# Patient Record
Sex: Female | Born: 1950 | Race: White | Hispanic: No | Smoking: Former smoker
Health system: Southern US, Community
[De-identification: ages and names within clinical notes are randomized; demographics above are authoritative.]

## PROBLEM LIST (undated history)

## (undated) DIAGNOSIS — S42009A Fracture of unspecified part of unspecified clavicle, initial encounter for closed fracture: Secondary | ICD-10-CM

## (undated) DIAGNOSIS — R0600 Dyspnea, unspecified: Secondary | ICD-10-CM

## (undated) HISTORY — PX: SHOULDER SURGERY: SHX246

---

## 2005-04-28 ENCOUNTER — Ambulatory Visit: Payer: Self-pay | Admitting: Obstetrics and Gynecology

## 2006-05-01 ENCOUNTER — Ambulatory Visit: Payer: Self-pay | Admitting: Obstetrics and Gynecology

## 2007-02-17 ENCOUNTER — Emergency Department: Payer: Self-pay | Admitting: Emergency Medicine

## 2007-05-08 ENCOUNTER — Ambulatory Visit: Payer: Self-pay | Admitting: Obstetrics and Gynecology

## 2008-05-21 ENCOUNTER — Ambulatory Visit: Payer: Self-pay | Admitting: Obstetrics and Gynecology

## 2009-05-26 ENCOUNTER — Ambulatory Visit: Payer: Self-pay | Admitting: Obstetrics and Gynecology

## 2009-06-04 ENCOUNTER — Ambulatory Visit: Payer: Self-pay | Admitting: Obstetrics and Gynecology

## 2010-06-01 ENCOUNTER — Ambulatory Visit: Payer: Self-pay | Admitting: Obstetrics and Gynecology

## 2011-06-07 ENCOUNTER — Ambulatory Visit: Payer: Self-pay | Admitting: Obstetrics and Gynecology

## 2012-06-07 ENCOUNTER — Ambulatory Visit: Payer: Self-pay | Admitting: Obstetrics and Gynecology

## 2013-06-11 ENCOUNTER — Ambulatory Visit: Payer: Self-pay | Admitting: Obstetrics and Gynecology

## 2014-07-04 ENCOUNTER — Ambulatory Visit: Payer: Self-pay | Admitting: Obstetrics and Gynecology

## 2016-01-12 ENCOUNTER — Other Ambulatory Visit: Payer: Self-pay | Admitting: Obstetrics and Gynecology

## 2016-01-12 DIAGNOSIS — Z1231 Encounter for screening mammogram for malignant neoplasm of breast: Secondary | ICD-10-CM

## 2016-01-18 ENCOUNTER — Ambulatory Visit
Admission: RE | Admit: 2016-01-18 | Discharge: 2016-01-18 | Disposition: A | Discharge status: Home / Self Care | Payer: BC Managed Care – PPO | Source: Ambulatory Visit | Attending: Obstetrics and Gynecology | Admitting: Obstetrics and Gynecology

## 2016-01-18 DIAGNOSIS — Z1231 Encounter for screening mammogram for malignant neoplasm of breast: Secondary | ICD-10-CM | POA: Diagnosis present

## 2017-01-31 ENCOUNTER — Other Ambulatory Visit: Payer: Self-pay | Admitting: Obstetrics and Gynecology

## 2017-01-31 DIAGNOSIS — Z1231 Encounter for screening mammogram for malignant neoplasm of breast: Secondary | ICD-10-CM

## 2017-02-24 ENCOUNTER — Ambulatory Visit
Admission: RE | Admit: 2017-02-24 | Discharge: 2017-02-24 | Disposition: A | Discharge status: Home / Self Care | Payer: Medicare Other | Source: Ambulatory Visit | Attending: Obstetrics and Gynecology | Admitting: Obstetrics and Gynecology

## 2017-02-24 DIAGNOSIS — Z1231 Encounter for screening mammogram for malignant neoplasm of breast: Secondary | ICD-10-CM

## 2018-07-29 ENCOUNTER — Other Ambulatory Visit: Payer: Self-pay

## 2018-07-29 ENCOUNTER — Emergency Department: Payer: Medicare Other

## 2018-07-29 ENCOUNTER — Emergency Department
Admission: EM | Admit: 2018-07-29 | Discharge: 2018-07-29 | Disposition: A | Discharge status: Home / Self Care | Payer: Medicare Other | Attending: Emergency Medicine | Admitting: Emergency Medicine

## 2018-07-29 DIAGNOSIS — S42022A Displaced fracture of shaft of left clavicle, initial encounter for closed fracture: Secondary | ICD-10-CM | POA: Insufficient documentation

## 2018-07-29 DIAGNOSIS — W548XXA Other contact with dog, initial encounter: Secondary | ICD-10-CM | POA: Insufficient documentation

## 2018-07-29 DIAGNOSIS — Y929 Unspecified place or not applicable: Secondary | ICD-10-CM | POA: Insufficient documentation

## 2018-07-29 DIAGNOSIS — Y9389 Activity, other specified: Secondary | ICD-10-CM | POA: Insufficient documentation

## 2018-07-29 DIAGNOSIS — S42009A Fracture of unspecified part of unspecified clavicle, initial encounter for closed fracture: Secondary | ICD-10-CM

## 2018-07-29 DIAGNOSIS — Y999 Unspecified external cause status: Secondary | ICD-10-CM | POA: Insufficient documentation

## 2018-07-29 DIAGNOSIS — M25512 Pain in left shoulder: Secondary | ICD-10-CM | POA: Diagnosis present

## 2018-07-29 HISTORY — DX: Fracture of unspecified part of unspecified clavicle, initial encounter for closed fracture: S42.009A

## 2018-07-29 MED ORDER — OXYCODONE HCL 5 MG PO TABS: 5.0000 mg | ORAL_TABLET | Freq: Four times a day (QID) | ORAL | 0 refills | Status: DC | PRN

## 2018-07-29 MED ORDER — IBUPROFEN 600 MG PO TABS
600.0000 mg | ORAL_TABLET | Freq: Once | ORAL | Status: AC
  Administered 2018-07-29: 600 mg via ORAL
  Filled 2018-07-29: qty 1

## 2018-07-29 MED ORDER — NAPROXEN 500 MG PO TABS: 500.0000 mg | ORAL_TABLET | Freq: Two times a day (BID) | ORAL | 0 refills | Status: AC

## 2018-07-29 MED ORDER — HYDROMORPHONE HCL 1 MG/ML IJ SOLN
0.5000 mg | Freq: Once | INTRAMUSCULAR | Status: AC
  Administered 2018-07-29: 0.5 mg via INTRAMUSCULAR
  Filled 2018-07-29: qty 1

## 2018-07-29 NOTE — ED Provider Notes (Signed)
[facility] Emergency Department Provider Note ____________________________________________  Time seen: Approximately 8:58 PM  I have reviewed the triage vital signs and the nursing notes.   HISTORY  Chief Complaint Shoulder Pain    HPI Monica Coffey is a 67 y.o. female who presents to the emergency department for evaluation and treatment of left shoulder pain. While walking her dogs she was pulled down and drug on the ground. She denies loss of consciousness or striking her head. No alleviating measures attempted prior to arrival.  No past medical history on file.  There are no active problems to display for this patient.   No past surgical history on file.  Prior to Admission medications   Medication Sig Start Date End Date Taking? Authorizing Provider  naproxen (NAPROSYN) 500 MG tablet Take 1 tablet (500 mg total) by mouth 2 (two) times daily with a meal. 07/29/18   Jacey Eckerson B, FNP  oxyCODONE (ROXICODONE) 5 MG immediate release tablet Take 1 tablet (5 mg total) by mouth every 6 (six) hours as needed for severe pain. 07/29/18   Chinita Pester, FNP    Allergies Patient has no known allergies.  No family history on file.  Social History Social History   Tobacco Use  . Smoking status: Not on file  Substance Use Topics  . Alcohol use: Not on file  . Drug use: Not on file    Review of Systems Constitutional: Negative for fever. Cardiovascular: Negative for chest pain. Respiratory: Negative for shortness of breath. Musculoskeletal: Positive for left shoulder pain. Skin: Positive for abrasions to the left elbow and forearm.  Neurological: Negative for decrease in sensation  ____________________________________________   PHYSICAL EXAM:  VITAL SIGNS: ED Triage Vitals  Enc Vitals Group     BP 07/29/18 2048 140/73     Pulse Rate 07/29/18 2048 90     Resp 07/29/18 2048 20     Temp 07/29/18 2048 98.6 F (37 C)     Temp src --    SpO2 07/29/18 2048 98 %     Weight 07/29/18 2046 106 lb (48.1 kg)     Height 07/29/18 2046 5\' 2"  (1.575 m)     Head Circumference --      Peak Flow --      Pain Score 07/29/18 2046 10     Pain Loc --      Pain Edu? --      Excl. in GC? --     Constitutional: Alert and oriented. Well appearing and in no acute distress. Eyes: Conjunctivae are clear without discharge or drainage Head: Atraumatic Neck: Supple Respiratory: No cough. Respirations are even and unlabored. Musculoskeletal: Focal pain over the left acromion/clavicle area with obvious deformity. FROM of the left elbow, wrist, and hand. No focal midline tenderness over the length of the spine. FROM of the lower extremities. Neurologic: Awake, alert, oriented x 4.  Skin: Superficial abrasions to the left forearm and elbow. No tenting of the skin of preclavicular area. Psychiatric: Affect and behavior are appropriate.  ____________________________________________   LABS (all labs ordered are listed, but only abnormal results are displayed)  Labs Reviewed - No data to display ____________________________________________  RADIOLOGY  Oblique fracture through the mid left clavicle with displacement. ____________________________________________   PROCEDURES  Procedures  ____________________________________________   INITIAL IMPRESSION / ASSESSMENT AND PLAN / ED COURSE  Monica Coffey is a 67 y.o. who presents to the emergency department for treatment and evaluation of left shoulder pain  after mechanical non-syncopal fall. X-ray shows a displaced clavicle fracture. Results were discussed with the patient who reports she is a friend of Van Clines, [place] in orthopedics. She plans to call and schedule an appointment for follow up. She was given 0.5mg  of dilaudid and ibuprofen with little relief while here. She was also placed in a sling. Prescriptions for pain control were submitted to her pharmacy. She was discharged home in the  care of family.  Medications  HYDROmorphone (DILAUDID) injection 0.5 mg (0.5 mg Intramuscular Given 07/29/18 2159)  ibuprofen (ADVIL,MOTRIN) tablet 600 mg (600 mg Oral Given 07/29/18 2233)    Pertinent labs & imaging results that were available during my care of the patient were reviewed by me and considered in my medical decision making (see chart for details).  _________________________________________   FINAL CLINICAL IMPRESSION(S) / ED DIAGNOSES  Final diagnoses:  Closed displaced fracture of shaft of left clavicle, initial encounter    ED Discharge Orders         Ordered    oxyCODONE (ROXICODONE) 5 MG immediate release tablet  Every 6 hours PRN     07/29/18 2156    naproxen (NAPROSYN) 500 MG tablet  2 times daily with meals     07/29/18 2156          If controlled substance prescribed during this visit, 12 month history viewed on the [facility] prior to issuing an initial prescription for Schedule II or III opiod.    Chinita Pester, FNP 07/29/18 2253    Sharman Cheek, MD 07/29/18 2358

## 2018-07-29 NOTE — Discharge Instructions (Signed)
Please call and schedule an appointment with orthopedics.  Return to the ER for symptoms that change or worsen.  It will likely be more comfortable sleeping in an upright position or with multiple pillows behind you.

## 2018-07-29 NOTE — ED Triage Notes (Addendum)
Reports walking dogs and they pulled her down.  Patient reporting left shoulder pain.

## 2018-07-29 NOTE — ED Notes (Signed)
Patient declined discharge vital signs. 

## 2018-09-17 ENCOUNTER — Other Ambulatory Visit: Payer: Self-pay | Admitting: Obstetrics and Gynecology

## 2018-09-17 DIAGNOSIS — Z1231 Encounter for screening mammogram for malignant neoplasm of breast: Secondary | ICD-10-CM

## 2018-10-10 ENCOUNTER — Ambulatory Visit
Admission: RE | Admit: 2018-10-10 | Discharge: 2018-10-10 | Disposition: A | Discharge status: Home / Self Care | Payer: Medicare Other | Source: Ambulatory Visit | Attending: Obstetrics and Gynecology | Admitting: Obstetrics and Gynecology

## 2018-10-10 DIAGNOSIS — Z1231 Encounter for screening mammogram for malignant neoplasm of breast: Secondary | ICD-10-CM | POA: Diagnosis not present

## 2018-10-18 ENCOUNTER — Ambulatory Visit: Payer: Self-pay | Admitting: Orthopedic Surgery

## 2018-10-30 ENCOUNTER — Encounter
Admission: RE | Admit: 2018-10-30 | Discharge: 2018-10-30 | Disposition: A | Discharge status: Home / Self Care | Payer: Medicare Other | Source: Ambulatory Visit | Attending: Orthopedic Surgery | Admitting: Orthopedic Surgery

## 2018-10-30 ENCOUNTER — Other Ambulatory Visit: Payer: Self-pay

## 2018-10-30 DIAGNOSIS — Z01818 Encounter for other preprocedural examination: Secondary | ICD-10-CM

## 2018-10-30 DIAGNOSIS — S42022K Displaced fracture of shaft of left clavicle, subsequent encounter for fracture with nonunion: Secondary | ICD-10-CM | POA: Diagnosis not present

## 2018-10-30 DIAGNOSIS — Z87891 Personal history of nicotine dependence: Secondary | ICD-10-CM | POA: Diagnosis not present

## 2018-10-30 DIAGNOSIS — Z791 Long term (current) use of non-steroidal anti-inflammatories (NSAID): Secondary | ICD-10-CM | POA: Diagnosis not present

## 2018-10-30 DIAGNOSIS — Z79899 Other long term (current) drug therapy: Secondary | ICD-10-CM | POA: Diagnosis not present

## 2018-10-30 DIAGNOSIS — X58XXXD Exposure to other specified factors, subsequent encounter: Secondary | ICD-10-CM | POA: Diagnosis not present

## 2018-10-30 DIAGNOSIS — Z79891 Long term (current) use of opiate analgesic: Secondary | ICD-10-CM | POA: Diagnosis not present

## 2018-10-30 HISTORY — DX: Dyspnea, unspecified: R06.00

## 2018-10-30 HISTORY — DX: Fracture of unspecified part of unspecified clavicle, initial encounter for closed fracture: S42.009A

## 2018-10-30 LAB — CBC
HEMATOCRIT: 41.5 % (ref 36.0–46.0)
HEMOGLOBIN: 12.9 g/dL (ref 12.0–15.0)
MCH: 32.3 pg (ref 26.0–34.0)
MCHC: 31.1 g/dL (ref 30.0–36.0)
MCV: 104 fL — AB (ref 80.0–100.0)
Platelets: 270 10*3/uL (ref 150–400)
RBC: 3.99 MIL/uL (ref 3.87–5.11)
RDW: 13.2 % (ref 11.5–15.5)
WBC: 4 10*3/uL (ref 4.0–10.5)
nRBC: 0 % (ref 0.0–0.2)

## 2018-10-30 LAB — BASIC METABOLIC PANEL
Anion gap: 6 (ref 5–15)
BUN: 20 mg/dL (ref 8–23)
CHLORIDE: 104 mmol/L (ref 98–111)
CO2: 30 mmol/L (ref 22–32)
CREATININE: 0.84 mg/dL (ref 0.44–1.00)
Calcium: 9.4 mg/dL (ref 8.9–10.3)
GFR calc non Af Amer: >60 mL/min (ref >60)
GLUCOSE: 86 mg/dL (ref 70–99)
Potassium: 4.4 mmol/L (ref 3.5–5.1)
Sodium: 140 mmol/L (ref 135–145)

## 2018-10-30 NOTE — Patient Instructions (Signed)
Your procedure is scheduled on: Friday, November 02, 2018  Report to THE SECOND FLOOR OF THE MEDICAL MALL    DO NOT STOP ON FIRST FLOOR TO REGISTER  To find out your arrival time please call [phone] between 1PM - 3PM on Thursday, November 01, 2018  Remember: Instructions that are not followed completely may result in serious medical risk,  up to and including death, or upon the discretion of your surgeon and anesthesiologist your  surgery may need to be rescheduled.     _X__ 1. Do not eat food after midnight the night before your procedure.                 No gum chewing or hard candies.                    ABSOLUTELY NOTHING SOLID IN YOUR MOUTH AFTER MIDNIGHT                  You may drink clear liquids up to 2 hours before you are scheduled to arrive for your surgery-                  DO not drink clear liquids within 2 hours of the start of your surgery.                   Clear Liquids include:  water, apple juice without pulp, clear carbohydrate                 drink such as Clearfast of Gatorade, Black Coffee or Tea (Do not add                 anything to coffee or tea).  __X__2.  On the morning of surgery brush your teeth with toothpaste and water,                     You may rinse your mouth with mouthwash if you wish.                         Do not swallow any toothpaste of mouthwash.     _X__ 3.  No Alcohol for 24 hours before or after surgery.   _X__ 4.  Do Not Smoke or use e-cigarettes For 24 Hours Prior to Your Surgery.                 Do not use any chewable tobacco products for at least 6 hours prior to                 surgery.  ____  5.  Bring all medications with you on the day of surgery if instructed.   ____  6.  Notify your doctor if there is any change in your medical condition      (cold, fever, infections).     Do not wear jewelry, make-up, hairpins, clips or nail polish. Do not wear lotions, powders, or perfumes. You may wear  deodorant. Do not shave 48 hours prior to surgery. Men may shave face and neck. Do not bring valuables to the hospital.    [facility] is not responsible for any belongings or valuables.  Contacts, dentures or bridgework may not be worn into surgery. Leave your suitcase in the car. After surgery it may be brought to your room. For patients admitted to the hospital, discharge time is determined by your treatment team.  Patients discharged the day of surgery will not be allowed to drive home.   Please read over the following fact sheets that you were given:   INCENTIVE SPIROMETER INSTRUCTIONS                    PREPARING FOR SURGERY   _X___ Take these medicines the morning of surgery with A SIP OF WATER:    1. EFFEXOR  2. DO NOT TAKE TYLENOL IN THE MORNING BUT IT IS OKAY TO                    TAKE ANY OTHER TIME  3.   4.  5.  6.  ____ Fleet Enema (as directed)   __X__ Use CHG Soap as directed  __X__ Stop ALL ASPIRIN PRODUCTS TODAY  ___X_ Stop Anti-inflammatories TODAY                THIS INCLUDES ADVIL / ALEVE / IBUPROFEN / MOTRIN / NAPROXYN  __X__ Stop supplements until after surgery.    ____ Bring C-Pap to the hospital.   CONTINUE TO TAKE TRAZADONE AT NIGHT  WEAR A LARGE LOOSE BUTTON DOWN SHIRT TO THE HOSPITAL.  ALSO, YOU MIGHT WANT TO BRING A T-SHIRT TO PUT UNDER THE SLING

## 2018-11-01 MED ORDER — CEFAZOLIN SODIUM-DEXTROSE 2-4 GM/100ML-% IV SOLN
2.0000 g | INTRAVENOUS | Status: AC
  Administered 2018-11-02: 2 g via INTRAVENOUS

## 2018-11-02 ENCOUNTER — Other Ambulatory Visit: Payer: Self-pay

## 2018-11-02 ENCOUNTER — Ambulatory Visit
Admission: RE | Admit: 2018-11-02 | Discharge: 2018-11-02 | Disposition: A | Discharge status: Home / Self Care | Payer: Medicare Other | Source: Ambulatory Visit | Attending: Orthopedic Surgery | Admitting: Orthopedic Surgery

## 2018-11-02 ENCOUNTER — Ambulatory Visit: Payer: Medicare Other | Admitting: Anesthesiology

## 2018-11-02 ENCOUNTER — Encounter: Payer: Self-pay | Admitting: *Deleted

## 2018-11-02 ENCOUNTER — Encounter
Admission: RE | Disposition: A | Discharge status: Home / Self Care | Payer: Self-pay | Source: Ambulatory Visit | Attending: Orthopedic Surgery

## 2018-11-02 ENCOUNTER — Ambulatory Visit: Payer: Medicare Other

## 2018-11-02 DIAGNOSIS — S42022K Displaced fracture of shaft of left clavicle, subsequent encounter for fracture with nonunion: Secondary | ICD-10-CM | POA: Insufficient documentation

## 2018-11-02 DIAGNOSIS — Z419 Encounter for procedure for purposes other than remedying health state, unspecified: Secondary | ICD-10-CM

## 2018-11-02 DIAGNOSIS — Z79891 Long term (current) use of opiate analgesic: Secondary | ICD-10-CM | POA: Insufficient documentation

## 2018-11-02 DIAGNOSIS — Z87891 Personal history of nicotine dependence: Secondary | ICD-10-CM | POA: Insufficient documentation

## 2018-11-02 DIAGNOSIS — X58XXXD Exposure to other specified factors, subsequent encounter: Secondary | ICD-10-CM | POA: Insufficient documentation

## 2018-11-02 DIAGNOSIS — Z79899 Other long term (current) drug therapy: Secondary | ICD-10-CM | POA: Insufficient documentation

## 2018-11-02 DIAGNOSIS — Z791 Long term (current) use of non-steroidal anti-inflammatories (NSAID): Secondary | ICD-10-CM | POA: Insufficient documentation

## 2018-11-02 HISTORY — PX: ORIF CLAVICULAR FRACTURE: SHX5055

## 2018-11-02 SURGERY — OPEN REDUCTION INTERNAL FIXATION (ORIF) CLAVICULAR FRACTURE
Anesthesia: General | Laterality: Left

## 2018-11-02 MED ORDER — LIDOCAINE HCL (CARDIAC) PF 100 MG/5ML IV SOSY
PREFILLED_SYRINGE | INTRAVENOUS | Status: DC | PRN
  Administered 2018-11-02: 100 mg via INTRAVENOUS

## 2018-11-02 MED ORDER — GABAPENTIN 300 MG PO CAPS
ORAL_CAPSULE | ORAL | Status: AC
  Administered 2018-11-02: 300 mg via ORAL
  Filled 2018-11-02: qty 1

## 2018-11-02 MED ORDER — BUPIVACAINE HCL (PF) 0.5 % IJ SOLN
INTRAMUSCULAR | Status: DC | PRN
  Administered 2018-11-02: 10 mL via PERINEURAL

## 2018-11-02 MED ORDER — FENTANYL CITRATE (PF) 100 MCG/2ML IJ SOLN
INTRAMUSCULAR | Status: DC | PRN
  Administered 2018-11-02: 50 ug via INTRAVENOUS
  Administered 2018-11-02 (×2): 25 ug via INTRAVENOUS

## 2018-11-02 MED ORDER — LACTATED RINGERS IV SOLN
INTRAVENOUS | Status: DC
  Administered 2018-11-02: 09:00:00 via INTRAVENOUS

## 2018-11-02 MED ORDER — MIDAZOLAM HCL 2 MG/2ML IJ SOLN
INTRAMUSCULAR | Status: DC | PRN
  Administered 2018-11-02 (×2): 1 mg via INTRAVENOUS

## 2018-11-02 MED ORDER — MIDAZOLAM HCL 2 MG/2ML IJ SOLN
1.0000 mg | Freq: Once | INTRAMUSCULAR | Status: AC
  Administered 2018-11-02: 1 mg via INTRAVENOUS

## 2018-11-02 MED ORDER — BUPIVACAINE HCL (PF) 0.5 % IJ SOLN
INTRAMUSCULAR | Status: AC
  Filled 2018-11-02: qty 10

## 2018-11-02 MED ORDER — HYDROCODONE-ACETAMINOPHEN 5-325 MG PO TABS: 1.0000 | ORAL_TABLET | ORAL | Status: DC | PRN

## 2018-11-02 MED ORDER — PROMETHAZINE HCL 25 MG/ML IJ SOLN: 6.2500 mg | INTRAMUSCULAR | Status: DC | PRN

## 2018-11-02 MED ORDER — OXYCODONE HCL 5 MG/5ML PO SOLN: 5.0000 mg | Freq: Once | ORAL | Status: DC | PRN

## 2018-11-02 MED ORDER — BACITRACIN 50000 UNITS IM SOLR
INTRAMUSCULAR | Status: AC
  Filled 2018-11-02: qty 1

## 2018-11-02 MED ORDER — PROPOFOL 10 MG/ML IV BOLUS
INTRAVENOUS | Status: AC
  Filled 2018-11-02: qty 20

## 2018-11-02 MED ORDER — FAMOTIDINE 20 MG PO TABS
20.0000 mg | ORAL_TABLET | Freq: Once | ORAL | Status: AC
  Administered 2018-11-02: 20 mg via ORAL

## 2018-11-02 MED ORDER — OXYCODONE HCL 5 MG PO TABS: 5.0000 mg | ORAL_TABLET | Freq: Four times a day (QID) | ORAL | 0 refills | Status: AC | PRN

## 2018-11-02 MED ORDER — BUPIVACAINE-EPINEPHRINE (PF) 0.25% -1:200000 IJ SOLN
INTRAMUSCULAR | Status: DC | PRN
  Administered 2018-11-02: 6 mL

## 2018-11-02 MED ORDER — GABAPENTIN 300 MG PO CAPS: 300.0000 mg | ORAL_CAPSULE | Freq: Three times a day (TID) | ORAL | Status: DC

## 2018-11-02 MED ORDER — ACETAMINOPHEN 500 MG PO TABS: 500.0000 mg | ORAL_TABLET | Freq: Four times a day (QID) | ORAL | Status: DC

## 2018-11-02 MED ORDER — BUPIVACAINE LIPOSOME 1.3 % IJ SUSP
INTRAMUSCULAR | Status: DC | PRN
  Administered 2018-11-02: 20 mL via PERINEURAL

## 2018-11-02 MED ORDER — MEPERIDINE HCL 50 MG/ML IJ SOLN: 6.2500 mg | INTRAMUSCULAR | Status: DC | PRN

## 2018-11-02 MED ORDER — ACETAMINOPHEN 325 MG PO TABS: 325.0000 mg | ORAL_TABLET | Freq: Four times a day (QID) | ORAL | Status: DC | PRN

## 2018-11-02 MED ORDER — SUCCINYLCHOLINE CHLORIDE 20 MG/ML IJ SOLN
INTRAMUSCULAR | Status: DC | PRN
  Administered 2018-11-02: 100 mg via INTRAVENOUS

## 2018-11-02 MED ORDER — GLYCOPYRROLATE 0.2 MG/ML IJ SOLN
INTRAMUSCULAR | Status: DC | PRN
  Administered 2018-11-02: 0.2 mg via INTRAVENOUS

## 2018-11-02 MED ORDER — KETOROLAC TROMETHAMINE 15 MG/ML IJ SOLN: 7.5000 mg | Freq: Four times a day (QID) | INTRAMUSCULAR | Status: DC

## 2018-11-02 MED ORDER — ONDANSETRON HCL 4 MG/2ML IJ SOLN
INTRAMUSCULAR | Status: DC | PRN
  Administered 2018-11-02 (×2): 4 mg via INTRAVENOUS

## 2018-11-02 MED ORDER — LIDOCAINE HCL (PF) 1 % IJ SOLN
INTRAMUSCULAR | Status: AC
  Filled 2018-11-02: qty 5

## 2018-11-02 MED ORDER — BUPIVACAINE LIPOSOME 1.3 % IJ SUSP
INTRAMUSCULAR | Status: AC
  Filled 2018-11-02: qty 20

## 2018-11-02 MED ORDER — BUPIVACAINE-EPINEPHRINE (PF) 0.25% -1:200000 IJ SOLN
INTRAMUSCULAR | Status: AC
  Filled 2018-11-02: qty 30

## 2018-11-02 MED ORDER — DOCUSATE SODIUM 100 MG PO CAPS: 100.0000 mg | ORAL_CAPSULE | Freq: Every day | ORAL | 2 refills | Status: AC | PRN

## 2018-11-02 MED ORDER — FAMOTIDINE 20 MG PO TABS
ORAL_TABLET | ORAL | Status: AC
  Administered 2018-11-02: 20 mg via ORAL
  Filled 2018-11-02: qty 1

## 2018-11-02 MED ORDER — PHENYLEPHRINE HCL 10 MG/ML IJ SOLN
INTRAMUSCULAR | Status: DC | PRN
  Administered 2018-11-02 (×3): 100 ug via INTRAVENOUS

## 2018-11-02 MED ORDER — SODIUM CHLORIDE 0.9 % IV SOLN
INTRAVENOUS | Status: DC | PRN
  Administered 2018-11-02: 50 ug/min via INTRAVENOUS

## 2018-11-02 MED ORDER — PHENOL 1.4 % MT LIQD: 1.0000 | OROMUCOSAL | Status: DC | PRN

## 2018-11-02 MED ORDER — ACETAMINOPHEN 500 MG PO TABS
ORAL_TABLET | ORAL | Status: AC
  Administered 2018-11-02: 1000 mg via ORAL
  Filled 2018-11-02: qty 2

## 2018-11-02 MED ORDER — DEXAMETHASONE SODIUM PHOSPHATE 10 MG/ML IJ SOLN
INTRAMUSCULAR | Status: DC | PRN
  Administered 2018-11-02: 10 mg via INTRAVENOUS

## 2018-11-02 MED ORDER — DOCUSATE SODIUM 100 MG PO CAPS: 100.0000 mg | ORAL_CAPSULE | Freq: Two times a day (BID) | ORAL | Status: DC

## 2018-11-02 MED ORDER — MENTHOL 3 MG MT LOZG: 1.0000 | LOZENGE | OROMUCOSAL | Status: DC | PRN

## 2018-11-02 MED ORDER — MIDAZOLAM HCL 2 MG/2ML IJ SOLN
INTRAMUSCULAR | Status: AC
  Filled 2018-11-02: qty 2

## 2018-11-02 MED ORDER — LIDOCAINE HCL (PF) 1 % IJ SOLN
INTRAMUSCULAR | Status: DC | PRN
  Administered 2018-11-02: 3 mL

## 2018-11-02 MED ORDER — CEFAZOLIN SODIUM-DEXTROSE 2-4 GM/100ML-% IV SOLN
INTRAVENOUS | Status: AC
  Filled 2018-11-02: qty 100

## 2018-11-02 MED ORDER — METOCLOPRAMIDE HCL 10 MG PO TABS: 5.0000 mg | ORAL_TABLET | Freq: Three times a day (TID) | ORAL | Status: DC | PRN

## 2018-11-02 MED ORDER — SEVOFLURANE IN SOLN
RESPIRATORY_TRACT | Status: AC
  Filled 2018-11-02: qty 250

## 2018-11-02 MED ORDER — FENTANYL CITRATE (PF) 100 MCG/2ML IJ SOLN: 25.0000 ug | INTRAMUSCULAR | Status: DC | PRN

## 2018-11-02 MED ORDER — FENTANYL CITRATE (PF) 100 MCG/2ML IJ SOLN
50.0000 ug | Freq: Once | INTRAMUSCULAR | Status: AC
  Administered 2018-11-02: 50 ug via INTRAVENOUS

## 2018-11-02 MED ORDER — ACETAMINOPHEN 500 MG PO TABS
1000.0000 mg | ORAL_TABLET | Freq: Once | ORAL | Status: AC
  Administered 2018-11-02: 1000 mg via ORAL

## 2018-11-02 MED ORDER — CHLORHEXIDINE GLUCONATE 4 % EX LIQD: 60.0000 mL | Freq: Once | CUTANEOUS | Status: DC

## 2018-11-02 MED ORDER — ROCURONIUM BROMIDE 100 MG/10ML IV SOLN
INTRAVENOUS | Status: DC | PRN
  Administered 2018-11-02: 40 mg via INTRAVENOUS
  Administered 2018-11-02: 10 mg via INTRAVENOUS

## 2018-11-02 MED ORDER — LACTATED RINGERS IV SOLN: INTRAVENOUS | Status: DC

## 2018-11-02 MED ORDER — MIDAZOLAM HCL 2 MG/2ML IJ SOLN
INTRAMUSCULAR | Status: AC
  Administered 2018-11-02: 1 mg via INTRAVENOUS
  Filled 2018-11-02: qty 2

## 2018-11-02 MED ORDER — SUGAMMADEX SODIUM 200 MG/2ML IV SOLN
INTRAVENOUS | Status: DC | PRN
  Administered 2018-11-02: 102.6 mg via INTRAVENOUS

## 2018-11-02 MED ORDER — OXYCODONE HCL 5 MG PO TABS: 5.0000 mg | ORAL_TABLET | Freq: Once | ORAL | Status: DC | PRN

## 2018-11-02 MED ORDER — HYDROCODONE-ACETAMINOPHEN 7.5-325 MG PO TABS: 1.0000 | ORAL_TABLET | ORAL | Status: DC | PRN

## 2018-11-02 MED ORDER — METOCLOPRAMIDE HCL 5 MG/ML IJ SOLN: 5.0000 mg | Freq: Three times a day (TID) | INTRAMUSCULAR | Status: DC | PRN

## 2018-11-02 MED ORDER — MORPHINE SULFATE (PF) 4 MG/ML IV SOLN: 0.5000 mg | INTRAVENOUS | Status: DC | PRN

## 2018-11-02 MED ORDER — PROPOFOL 10 MG/ML IV BOLUS
INTRAVENOUS | Status: DC | PRN
  Administered 2018-11-02: 100 mg via INTRAVENOUS

## 2018-11-02 MED ORDER — ONDANSETRON HCL 4 MG PO TABS: 4.0000 mg | ORAL_TABLET | Freq: Four times a day (QID) | ORAL | Status: DC | PRN

## 2018-11-02 MED ORDER — GABAPENTIN 300 MG PO CAPS
300.0000 mg | ORAL_CAPSULE | Freq: Once | ORAL | Status: AC
  Administered 2018-11-02: 300 mg via ORAL

## 2018-11-02 MED ORDER — FENTANYL CITRATE (PF) 100 MCG/2ML IJ SOLN
INTRAMUSCULAR | Status: AC
  Administered 2018-11-02: 50 ug via INTRAVENOUS
  Filled 2018-11-02: qty 2

## 2018-11-02 MED ORDER — ONDANSETRON HCL 4 MG/2ML IJ SOLN: 4.0000 mg | Freq: Four times a day (QID) | INTRAMUSCULAR | Status: DC | PRN

## 2018-11-02 MED ORDER — FENTANYL CITRATE (PF) 100 MCG/2ML IJ SOLN
INTRAMUSCULAR | Status: AC
  Filled 2018-11-02: qty 2

## 2018-11-02 SURGICAL SUPPLY — 58 items
BNDG COHESIVE 4X5 TAN STRL (GAUZE/BANDAGES/DRESSINGS) ×3 IMPLANT
CANISTER SUCT 1200ML W/VALVE (MISCELLANEOUS) ×3 IMPLANT
CHLORAPREP W/TINT 26ML (MISCELLANEOUS) ×3 IMPLANT
CLOSURE WOUND 1/2 X4 (GAUZE/BANDAGES/DRESSINGS) ×1
COOLER POLAR GLACIER W/PUMP (MISCELLANEOUS) ×3 IMPLANT
COVER WAND RF STERILE (DRAPES) ×3 IMPLANT
CRADLE LAMINECT ARM (MISCELLANEOUS) ×6 IMPLANT
DRAPE C-ARM XRAY 36X54 (DRAPES) ×9 IMPLANT
DRAPE IMP U-DRAPE 54X76 (DRAPES) ×3 IMPLANT
DRAPE INCISE IOBAN 66X45 STRL (DRAPES) ×3 IMPLANT
DRAPE SHEET LG 3/4 BI-LAMINATE (DRAPES) ×3 IMPLANT
ELECT REM PT RETURN 9FT ADLT (ELECTROSURGICAL) ×3
ELECTRODE REM PT RTRN 9FT ADLT (ELECTROSURGICAL) ×1 IMPLANT
GAUZE PETRO XEROFOAM 1X8 (MISCELLANEOUS) ×3 IMPLANT
GLOVE BIO SURGEON STRL SZ8 (GLOVE) ×3 IMPLANT
GLOVE BIOGEL PI IND STRL 8.5 (GLOVE) ×1 IMPLANT
GLOVE BIOGEL PI INDICATOR 8.5 (GLOVE) ×2
GLOVE INDICATOR 8.0 STRL GRN (GLOVE) ×3 IMPLANT
GLOVE SURG ORTHO 8.0 STRL STRW (GLOVE) ×3 IMPLANT
GOWN STRL REUS W/ TWL LRG LVL3 (GOWN DISPOSABLE) ×3 IMPLANT
GOWN STRL REUS W/TWL LRG LVL3 (GOWN DISPOSABLE) ×6
KIT STABILIZATION SHOULDER (MISCELLANEOUS) ×3 IMPLANT
KIT TURNOVER KIT A (KITS) ×3 IMPLANT
MASK FACE SPIDER DISP (MASK) ×3 IMPLANT
MAT ABSORB  FLUID 56X50 GRAY (MISCELLANEOUS) ×2
MAT ABSORB FLUID 56X50 GRAY (MISCELLANEOUS) ×1 IMPLANT
NEEDLE FILTER BLUNT 18X 1/2SAF (NEEDLE) ×2
NEEDLE FILTER BLUNT 18X1 1/2 (NEEDLE) ×1 IMPLANT
NS IRRIG 500ML POUR BTL (IV SOLUTION) ×3 IMPLANT
PACK ARTHROSCOPY SHOULDER (MISCELLANEOUS) ×3 IMPLANT
PAD WRAPON POLAR SHDR XLG (MISCELLANEOUS) ×1 IMPLANT
PLATE CLAVICLE 3.5X85 6H LFT (Plate) ×3 IMPLANT
SCREW CORTEX 3.5 12MM (Screw) ×4 IMPLANT
SCREW CORTEX 3.5 14MM (Screw) ×2 IMPLANT
SCREW CORTEX 3.5 16MM (Screw) ×2 IMPLANT
SCREW CORTEX 3.5 18MM (Screw) ×2 IMPLANT
SCREW LOCK CORT ST 3.5X12 (Screw) ×2 IMPLANT
SCREW LOCK CORT ST 3.5X14 (Screw) ×1 IMPLANT
SCREW LOCK CORT ST 3.5X16 (Screw) ×1 IMPLANT
SCREW LOCK CORT ST 3.5X18 (Screw) ×1 IMPLANT
SCREW LOCK T15 FT 10X3.5X2.9X (Screw) ×2 IMPLANT
SCREW LOCKING 3.5X10 (Screw) ×4 IMPLANT
SLING ARM M TX990204 (SOFTGOODS) ×3 IMPLANT
STAPLER SKIN PROX 35W (STAPLE) ×3 IMPLANT
STRAP SAFETY 5IN WIDE (MISCELLANEOUS) ×3 IMPLANT
STRIP CLOSURE SKIN 1/2X4 (GAUZE/BANDAGES/DRESSINGS) ×2 IMPLANT
SUT ETHILON 4-0 (SUTURE) ×2
SUT ETHILON 4-0 FS2 18XMFL BLK (SUTURE) ×1
SUT MNCRL 4-0 (SUTURE) ×2
SUT MNCRL 4-0 27XMFL (SUTURE) ×1
SUT VIC AB 0 CT1 36 (SUTURE) ×3 IMPLANT
SUT VIC AB 2-0 CT1 27 (SUTURE) ×4
SUT VIC AB 2-0 CT1 TAPERPNT 27 (SUTURE) ×2 IMPLANT
SUT VIC AB 2-0 CT2 27 (SUTURE) ×3 IMPLANT
SUTURE ETHLN 4-0 FS2 18XMF BLK (SUTURE) ×1 IMPLANT
SUTURE MNCRL 4-0 27XMF (SUTURE) ×1 IMPLANT
SYR 10ML LL (SYRINGE) ×3 IMPLANT
WRAPON POLAR PAD SHDR XLG (MISCELLANEOUS) ×3

## 2018-11-02 NOTE — Anesthesia Procedure Notes (Signed)
Procedure Name: Intubation Date/Time: 11/02/2018 10:20 AM Performed by: Nelda Marseille, CRNA Pre-anesthesia Checklist: Patient identified, Patient being monitored, Timeout performed, Emergency Drugs available and Suction available Patient Re-evaluated:Patient Re-evaluated prior to induction Oxygen Delivery Method: Circle system utilized Preoxygenation: Pre-oxygenation with 100% oxygen Induction Type: IV induction Ventilation: Mask ventilation without difficulty Laryngoscope Size: Mac, 3 and McGraph Grade View: Grade III Tube type: Oral Tube size: 7.0 mm Number of attempts: 1 Airway Equipment and Method: Stylet and Video-laryngoscopy Placement Confirmation: ETT inserted through vocal cords under direct vision,  positive ETCO2 and breath sounds checked- equal and bilateral Secured at: 21 cm Tube secured with: Tape Dental Injury: Teeth and Oropharynx as per pre-operative assessment  Difficulty Due To: Difficulty was anticipated and Difficult Airway- due to anterior larynx

## 2018-11-02 NOTE — Discharge Instructions (Signed)
Wear sling at all times, including sleep.  You will need to use the sling for a total of 4 weeks following surgery.  Do not try and lift your arm up or away from your body for any reason.   Keep the dressing dry.  You may remove bandage in 3 days.  You may place a Band-Aid over top of the incision.  May shower once dressing is removed in 3 days.  Remove sling carefully only for showers, leaving arm down by your side while in the shower.  +++ Make sure to take some pain medication this evening before you fall asleep, in preparation for the nerve block wearing off in the middle of the night.  If the the pain medication causes itching, or is too strong, try taking a single tablet at a time, or combining with Benadryl.  You may be most comfortable sleeping in a recliner.  If you do sleep in near bed, placed pillows behind the shoulder that have the operation to support it.        Interscalene Nerve Block with Exparel  1.  For your surgery you have received an Interscalene Nerve Block with Exparel. 2. Nerve Blocks affect many types of nerves, including nerves that control movement, pain and normal sensation.  You may experience feelings such as numbness, tingling, heaviness, weakness or the inability to move your arm or the feeling or sensation that your arm has "fallen asleep". 3. A nerve block with Exparel can last up to 5 days.  Usually the weakness wears off first.  The tingling and heaviness usually wear off next.  Finally you may start to notice pain.  Keep in mind that this may occur in any order.  Once a nerve block starts to wear off it is usually completely gone within 60 minutes. 4. ISNB may cause mild shortness of breath, a hoarse voice, blurry vision, unequal pupils, or drooping of the face on the same side as the nerve block.  These symptoms will usually resolve with the numbness.  Very rarely the procedure itself can cause mild seizures. 5. If needed, your surgeon will give you a  prescription for pain medication.  It will take about 60 minutes for the oral pain medication to become fully effective.  So, it is recommended that you start taking this medication before the nerve block first begins to wear off, or when you first begin to feel discomfort. 6. Take your pain medication only as prescribed.  Pain medication can cause sedation and decrease your breathing if you take more than you need for the level of pain that you have. 7. Nausea is a common side effect of many pain medications.  You may want to eat something before taking your pain medicine to prevent nausea. 8. After an Interscalene nerve block, you cannot feel pain, pressure or extremes in temperature in the effected arm.  Because your arm is numb it is at an increased risk for injury.  To decrease the possibility of injury, please practice the following:  a. While you are awake change the position of your arm frequently to prevent too much pressure on any one area for prolonged periods of time. b.  If you have a cast or tight dressing, check the color or your fingers every couple of hours.  Call your surgeon with the appearance of any discoloration (white or blue). c. If you are given a sling to wear before you go home, please wear it  at all times  until the block has completely worn off.  Do not get up at night without your sling. d. Please contact [facility] or your surgeon if you do not begin to regain sensation after 7 days from the surgery.  Anesthesia may be contacted by calling the Same Day Surgery Department, Mon. through Fri., 6 am to 4 pm at [phone].   e. If you experience any other problems or concerns, please contact your surgeon's office. f. If you experience severe or prolonged shortness of breath go to the nearest emergency department.  AMBULATORY SURGERY  DISCHARGE INSTRUCTIONS   1) The drugs that you were given will stay in your system until tomorrow so for the next 24 hours you should  not:  A) Drive an automobile B) Make any legal decisions C) Drink any alcoholic beverage   2) You may resume regular meals tomorrow.  Today it is better to start with liquids and gradually work up to solid foods.  You may eat anything you prefer, but it is better to start with liquids, then soup and crackers, and gradually work up to solid foods.   3) Please notify your doctor immediately if you have any unusual bleeding, trouble breathing, redness and pain at the surgery site, drainage, fever, or pain not relieved by medication.    4) Additional Instructions:        Please contact your physician with any problems or Same Day Surgery at [phone], Monday through Friday 6 am to 4 pm, or [place] at [facility] Main number at [phone].

## 2018-11-02 NOTE — Anesthesia Postprocedure Evaluation (Signed)
Anesthesia Post Note  Patient: Konrad DoloresDorothy B Steinmetz  Procedure(s) Performed: OPEN REDUCTION INTERNAL FIXATION (ORIF) CLAVICULAR FRACTURE (Left )  Patient location during evaluation: PACU Anesthesia Type: General Level of consciousness: awake and alert and oriented Pain management: pain level controlled Vital Signs Assessment: post-procedure vital signs reviewed and stable Respiratory status: spontaneous breathing, nonlabored ventilation and respiratory function stable Cardiovascular status: blood pressure returned to baseline and stable Postop Assessment: no signs of nausea or vomiting Anesthetic complications: no     Last Vitals:  Vitals:   11/02/18 1342 11/02/18 1356  BP: (!) 105/56 118/66  Pulse: 70 66  Resp: 13 18  Temp: (!) 36.1 C (!) 36.2 C  SpO2: 98% 99%    Last Pain:  Vitals:   11/02/18 1356  TempSrc: Temporal  PainSc: 0-No pain                 Derrico Zhong

## 2018-11-02 NOTE — Anesthesia Procedure Notes (Signed)
Anesthesia Regional Block: Interscalene brachial plexus block   Pre-Anesthetic Checklist: ,, timeout performed, Correct Patient, Correct Site, Correct Laterality, Correct Procedure, Correct Position, site marked, Risks and benefits discussed,  Surgical consent,  Pre-op evaluation,  At surgeon's request and post-op pain management  Laterality: Left  Prep: chloraprep       Needles:  Injection technique: Single-shot  Needle Type: Stimiplex     Needle Length: 10cm  Needle Gauge: 21     Additional Needles:   Procedures:,,,, ultrasound used (permanent image in chart),,,,   Nerve Stimulator or Paresthesia:  Response: biceps flexion,   Additional Responses:   Narrative:  Start time: 11/02/2018 9:57 AM End time: 11/02/2018 10:02 AM Injection made incrementally with aspirations every 5 mL.  Performed by: Personally  Anesthesiologist: Alver FisherPenwarden, Dominigue Gellner, MD  Additional Notes: Functioning IV was confirmed and monitors were applied.  A Stimuplex needle was used. Sterile prep and drape,hand hygiene and sterile gloves were used.  Negative aspiration and negative test dose prior to incremental administration of local anesthetic. The patient tolerated the procedure well.

## 2018-11-02 NOTE — Anesthesia Post-op Follow-up Note (Signed)
Anesthesia QCDR form completed.        

## 2018-11-02 NOTE — Op Note (Signed)
11/02/2018  1:15 PM  Patient:   [name]  Pre-Op Diagnosis:   Displaced midshaft left clavicle fracture non-union  Post-Op Diagnosis:   Same.  Procedure:   Open reduction and internal fixation of displaced midshaft left clavicle fracture.  Surgeon:   Cassell SmilesJames Angelique Chevalier, MD  Assistant:   Altamese CabalMaurice Jones, PA-C  Anesthesia:   GET  Findings:   As above.  Complications:   None Apparent  EBL:   25 cc  Implants:   Synthes 6-hole precontoured left clavicular plate.  Brief Clinical Note:   The patient is 67 year old female with chronic left shoulder pain for the past 3.5 months after a fall and displaced clavicle fracture. She has failed conservative management and there is no evidence of fracture healing. The patient presents at this time for definitive management of this injury.  Procedure:   The patient was brought into the operating room and lain in the supine position. After adequate general endotracheal intubation and anesthesia were obtained, the patient was repositioned in the beach chair position using the beach chair positioner. The left shoulder and upper extremity were prepped with ChloraPrep solution before being draped sterilely. Preoperative antibiotics were administered. After performing a timeout to verify the appropriate surgical site, an approximately 8-10 cm obliquely oriented incision was made over the clavicle, centered over the fracture. The incision was carried down through the subcutaneous tissues to expose the platysma. This was split the length of the incision and the underlying clavicle identified. The clavipectoral fascia was divided over the fracture and subperiosteal dissection carried out sufficiently to expose the fracture. Fibrous tissue was removed using a rongeur. Drill holes were made through the fracture ends to stimulate healing. The butterfly fragment was removed and local bone graft was prepared on the back table. The fracture was reduced and temporarily  secured using a bone holding clamp. A 6-hole plate was selected and applied over the fracture. This appeared to fit quite well, enabling six cortical fixation sites both proximal and distal to the fracture. The plate was applied over the fracture and temporarily held in place with a bone-holding clamp which also maintained fracture reduction. One bicortical screw was placed in the proximal fragment before a second bicortical screw was placed distally in the slotted hole so that it provided compression across the fracture. Four additional bicortical screws were placed to complete fracture fixation. The adequacy of fracture reduction and hardware position was verified fluoroscopically in AP and oblique projections and found to be excellent.   The wound was copiously irrigated with sterile saline solution before the clavipectoral fascia was reapproximated using 0 Vicryl interrupted sutures. The platysma and subcutaneous tissue was closed using 2-0 Vicryl interrupted sutures before the skin was closed using staples.  A  sterile dressing was applied to the wound. The patient was placed into a shoulder sling before being awakened, extubated, and returned to the recovery room in satisfactory condition after tolerating the procedure well.

## 2018-11-02 NOTE — H&P (Signed)
The patient has been re-examined, and the chart reviewed, and there have been no interval changes to the documented history and physical.  Plan a left clavicle repair of non-union today.  Anesthesia is consulted regarding a peripheral nerve block for post-operative pain.  The risks, benefits, and alternatives have been discussed at length, and the patient is willing to proceed.

## 2018-11-02 NOTE — Transfer of Care (Signed)
Immediate Anesthesia Transfer of Care Note  Patient: Monica Coffey  Procedure(s) Performed: OPEN REDUCTION INTERNAL FIXATION (ORIF) CLAVICULAR FRACTURE (Left )  Patient Location: PACU  Anesthesia Type:General  Level of Consciousness: sedated  Airway & Oxygen Therapy: Patient Spontanous Breathing and Patient connected to face mask oxygen  Post-op Assessment: Report given to RN and Post -op Vital signs reviewed and stable  Post vital signs: Reviewed and stable  Last Vitals:  Vitals Value Taken Time  BP 94/42 11/02/2018  1:02 PM  Temp    Pulse 69 11/02/2018  1:11 PM  Resp 10 11/02/2018  1:11 PM  SpO2 100 % 11/02/2018  1:11 PM  Vitals shown include unvalidated device data.  Last Pain:  Vitals:   11/02/18 0941  TempSrc:   PainSc: 0-No pain         Complications: No apparent anesthesia complications

## 2018-11-02 NOTE — Anesthesia Preprocedure Evaluation (Signed)
Anesthesia Evaluation  Patient identified by MRN, date of birth, ID band Patient awake    Reviewed: Allergy & Precautions, NPO status , Patient's Chart, lab work & pertinent test results  History of Anesthesia Complications Negative for: history of anesthetic complications  Airway Mallampati: II  TM Distance: <3 FB Neck ROM: Full    Dental  (+) Poor Dentition   Pulmonary neg sleep apnea, neg COPD, former smoker,    breath sounds clear to auscultation- rhonchi (-) wheezing      Cardiovascular Exercise Tolerance: Good (-) hypertension(-) CAD, (-) Past MI, (-) Cardiac Stents and (-) CABG  Rhythm:Regular Rate:Normal - Systolic murmurs and - Diastolic murmurs    Neuro/Psych neg Seizures negative neurological ROS  negative psych ROS   GI/Hepatic negative GI ROS, Neg liver ROS,   Endo/Other  negative endocrine ROSneg diabetes  Renal/GU negative Renal ROS     Musculoskeletal negative musculoskeletal ROS (+)   Abdominal (+) - obese,   Peds  Hematology negative hematology ROS (+)   Anesthesia Other Findings Past Medical History: 07/2018: Broken clavicle No date: Dyspnea     Comment:  when walking uphill   Reproductive/Obstetrics                             Anesthesia Physical Anesthesia Plan  ASA: II  Anesthesia Plan: General   Post-op Pain Management:  Regional for Post-op pain   Induction: Intravenous  PONV Risk Score and Plan: 2 and Ondansetron, Dexamethasone and Midazolam  Airway Management Planned: Oral ETT  Additional Equipment:   Intra-op Plan:   Post-operative Plan: Extubation in OR  Informed Consent: I have reviewed the patients History and Physical, chart, labs and discussed the procedure including the risks, benefits and alternatives for the proposed anesthesia with the patient or authorized representative who has indicated his/her understanding and acceptance.   Dental  advisory given  Plan Discussed with: CRNA and Anesthesiologist  Anesthesia Plan Comments:         Anesthesia Quick Evaluation

## 2018-11-05 ENCOUNTER — Encounter: Payer: Self-pay | Admitting: Orthopedic Surgery

## 2019-10-15 ENCOUNTER — Other Ambulatory Visit: Payer: Self-pay

## 2019-10-15 DIAGNOSIS — Z20822 Contact with and (suspected) exposure to covid-19: Secondary | ICD-10-CM

## 2019-10-17 LAB — NOVEL CORONAVIRUS, NAA: SARS-CoV-2, NAA: NOT DETECTED

## 2020-01-10 ENCOUNTER — Ambulatory Visit: Payer: BC Managed Care – PPO | Attending: Internal Medicine

## 2020-01-10 DIAGNOSIS — Z23 Encounter for immunization: Secondary | ICD-10-CM

## 2020-01-10 NOTE — Progress Notes (Signed)
   Covid-19 Vaccination Clinic  Name:  Monica Coffey    MRN: [id number] DOB: 10-Oct-1951  01/10/2020  [name] was observed post Covid-19 immunization for 15 minutes without incidence. She was provided with Vaccine Information Sheet and instruction to access the V-Safe system.   [name] was instructed to call 911 with any severe reactions post vaccine: Marland Kitchen Difficulty breathing  . Swelling of your face and throat  . A fast heartbeat  . A bad rash all over your body  . Dizziness and weakness    Immunizations Administered    Name Date Dose VIS Date Route   Pfizer COVID-19 Vaccine 01/10/2020 11:59 AM 0.3 mL 11/08/2019 Intramuscular   Manufacturer: [employer], [employer]   Lot: [id number]   NDC: [address]-7

## 2020-02-04 ENCOUNTER — Ambulatory Visit: Payer: Medicare PPO | Attending: Internal Medicine

## 2020-02-04 DIAGNOSIS — Z23 Encounter for immunization: Secondary | ICD-10-CM | POA: Insufficient documentation

## 2020-02-04 NOTE — Progress Notes (Signed)
   Covid-19 Vaccination Clinic  Name:  Monica Coffey    MRN: [id number] DOB: 14-Nov-1951  02/04/2020  [name] was observed post Covid-19 immunization for 15 minutes without incident. She was provided with Vaccine Information Sheet and instruction to access the V-Safe system.   [name] was instructed to call 911 with any severe reactions post vaccine: Marland Kitchen Difficulty breathing  . Swelling of face and throat  . A fast heartbeat  . A bad rash all over body  . Dizziness and weakness   Immunizations Administered    Name Date Dose VIS Date Route   Pfizer COVID-19 Vaccine 02/04/2020 12:35 PM 0.3 mL 11/08/2019 Intramuscular   Manufacturer: [employer], [employer]   Lot: [id number]   NDC: [address]-9

## 2020-03-19 ENCOUNTER — Other Ambulatory Visit: Payer: Self-pay | Admitting: Internal Medicine

## 2020-03-19 DIAGNOSIS — Z1231 Encounter for screening mammogram for malignant neoplasm of breast: Secondary | ICD-10-CM

## 2020-03-24 ENCOUNTER — Ambulatory Visit
Admission: RE | Admit: 2020-03-24 | Discharge: 2020-03-24 | Disposition: A | Discharge status: Home / Self Care | Payer: Medicare PPO | Source: Ambulatory Visit | Attending: Internal Medicine | Admitting: Internal Medicine

## 2020-03-24 DIAGNOSIS — Z1231 Encounter for screening mammogram for malignant neoplasm of breast: Secondary | ICD-10-CM

## 2020-04-04 ENCOUNTER — Other Ambulatory Visit: Payer: Self-pay

## 2020-04-04 ENCOUNTER — Emergency Department
Admission: EM | Admit: 2020-04-04 | Discharge: 2020-04-04 | Disposition: A | Discharge status: Home / Self Care | Payer: Medicare PPO | Attending: Emergency Medicine | Admitting: Emergency Medicine

## 2020-04-04 ENCOUNTER — Emergency Department: Payer: Medicare PPO

## 2020-04-04 DIAGNOSIS — Y929 Unspecified place or not applicable: Secondary | ICD-10-CM | POA: Insufficient documentation

## 2020-04-04 DIAGNOSIS — Z79899 Other long term (current) drug therapy: Secondary | ICD-10-CM | POA: Diagnosis not present

## 2020-04-04 DIAGNOSIS — Y999 Unspecified external cause status: Secondary | ICD-10-CM | POA: Insufficient documentation

## 2020-04-04 DIAGNOSIS — S52601A Unspecified fracture of lower end of right ulna, initial encounter for closed fracture: Secondary | ICD-10-CM | POA: Diagnosis not present

## 2020-04-04 DIAGNOSIS — Y9389 Activity, other specified: Secondary | ICD-10-CM | POA: Insufficient documentation

## 2020-04-04 DIAGNOSIS — W010XXA Fall on same level from slipping, tripping and stumbling without subsequent striking against object, initial encounter: Secondary | ICD-10-CM | POA: Diagnosis not present

## 2020-04-04 DIAGNOSIS — Z87891 Personal history of nicotine dependence: Secondary | ICD-10-CM | POA: Insufficient documentation

## 2020-04-04 DIAGNOSIS — S51811A Laceration without foreign body of right forearm, initial encounter: Secondary | ICD-10-CM | POA: Insufficient documentation

## 2020-04-04 DIAGNOSIS — M25531 Pain in right wrist: Secondary | ICD-10-CM

## 2020-04-04 DIAGNOSIS — S59911A Unspecified injury of right forearm, initial encounter: Secondary | ICD-10-CM | POA: Diagnosis present

## 2020-04-04 MED ORDER — OXYCODONE-ACETAMINOPHEN 5-325 MG PO TABS
1.0000 | ORAL_TABLET | Freq: Once | ORAL | Status: AC
  Administered 2020-04-04: 1 via ORAL
  Filled 2020-04-04: qty 1

## 2020-04-04 MED ORDER — ONDANSETRON HCL 4 MG PO TABS: 4.0000 mg | ORAL_TABLET | Freq: Three times a day (TID) | ORAL | 0 refills | Status: AC | PRN

## 2020-04-04 MED ORDER — OXYCODONE-ACETAMINOPHEN 5-325 MG PO TABS: 1.0000 | ORAL_TABLET | Freq: Four times a day (QID) | ORAL | 0 refills | Status: AC | PRN

## 2020-04-04 MED ORDER — ONDANSETRON 4 MG PO TBDP
4.0000 mg | ORAL_TABLET | Freq: Once | ORAL | Status: AC
  Administered 2020-04-04: 4 mg via ORAL
  Filled 2020-04-04: qty 1

## 2020-04-04 MED ORDER — CEPHALEXIN 500 MG PO CAPS: 500.0000 mg | ORAL_CAPSULE | Freq: Three times a day (TID) | ORAL | 0 refills | Status: AC

## 2020-04-04 MED ORDER — LIDOCAINE HCL 1 % IJ SOLN
5.0000 mL | Freq: Once | INTRAMUSCULAR | Status: DC
  Filled 2020-04-04: qty 10

## 2020-04-04 NOTE — ED Notes (Signed)
No peripheral IV placed this visit.   Discharge instructions reviewed with patient. Questions fielded by this RN. Patient verbalizes understanding of instructions. Patient discharged home in stable condition per provider. No acute distress noted at time of discharge.  \ Pt wheeled to parking lot family car

## 2020-04-04 NOTE — ED Notes (Signed)
Pt transported to X-ray. X-ray aware to bring pt to ED rm 51

## 2020-04-04 NOTE — ED Notes (Signed)
Ortho tech en route for splint application.

## 2020-04-04 NOTE — Discharge Instructions (Signed)
Take Keflex 3 times daily for the next 7 days. 

## 2020-04-04 NOTE — ED Provider Notes (Signed)
Emergency Department Provider Note  ____________________________________________  Time seen: Approximately 10:13 PM  I have reviewed the triage vital signs and the nursing notes.   HISTORY  Chief Complaint Wrist Pain   Historian Patient     HPI Monica Coffey is a 69 y.o. female presents to the emergency department with a triangular-shaped laceration along the radial aspect of her right forearm and a 0.5 cm laceration along the ulnar aspect of her right forearm.  Patient states that she was trying to break her dogs apart and fell.  She denies being bitten or scratched by animals.  She states that she has experienced severe pain since injury occurred.  No numbness or tingling in the right hand.  No similar injuries have occurred in the past.   Past Medical History:  Diagnosis Date  . Broken clavicle 07/2018  . Dyspnea    when walking uphill     Immunizations up to date:  Yes.     Past Medical History:  Diagnosis Date  . Broken clavicle 07/2018  . Dyspnea    when walking uphill    There are no problems to display for this patient.   Past Surgical History:  Procedure Laterality Date  . ORIF CLAVICULAR FRACTURE Left 11/02/2018   Procedure: OPEN REDUCTION INTERNAL FIXATION (ORIF) CLAVICULAR FRACTURE;  Surgeon: Lyndle Herrlich, MD;  Location: [facility] ORS;  Service: Orthopedics;  Laterality: Left;  . SHOULDER SURGERY Right    patient unsure of what type of surgery she had.dr. Gavin Potters performed surgery    Prior to Admission medications   Medication Sig Start Date End Date Taking? Authorizing Provider  acetaminophen (TYLENOL) 650 MG CR tablet Take 650 mg by mouth every 8 (eight) hours as needed for pain.    [provider]  Calcium Carbonate-Vitamin D (CALCIUM 600+D PO) Take 1 tablet by mouth 2 (two) times daily.    [provider]  cephALEXin (KEFLEX) 500 MG capsule Take 1 capsule (500 mg total) by mouth 3 (three) times daily for 7 days. 04/04/20  04/11/20  Orvil Feil, PA-C  ferrous sulfate 325 (65 FE) MG tablet Take 325 mg by mouth every other day.    [provider]  latanoprost (XALATAN) 0.005 % ophthalmic solution Place 1 drop into both eyes at bedtime.    [provider]  Lutein 20 MG TABS Take 20 mg by mouth every evening.    [provider]  Multiple Vitamin (MULTIVITAMIN WITH MINERALS) TABS tablet Take 1 tablet by mouth daily.    [provider]  naproxen (NAPROSYN) 500 MG tablet Take 1 tablet (500 mg total) by mouth 2 (two) times daily with a meal. Patient not taking: Reported on 10/26/2018 07/29/18   Kem Boroughs B, FNP  ondansetron (ZOFRAN) 4 MG tablet Take 1 tablet (4 mg total) by mouth every 8 (eight) hours as needed for up to 5 days for nausea or vomiting. 04/04/20 04/09/20  Orvil Feil, PA-C  oxyCODONE (ROXICODONE) 5 MG immediate release tablet Take 1 tablet (5 mg total) by mouth every 6 (six) hours as needed for up to 20 doses for severe pain. 11/02/18   Lyndle Herrlich, MD  oxyCODONE-acetaminophen (PERCOCET/ROXICET) 5-325 MG tablet Take 1 tablet by mouth every 6 (six) hours as needed for up to 3 days. 04/04/20 04/07/20  Orvil Feil, PA-C  traZODone (DESYREL) 50 MG tablet Take 50 mg by mouth every evening. 08/27/18   [provider]  venlafaxine XR (EFFEXOR-XR) 75 MG 24  hr capsule Take 75 mg by mouth every morning. 07/16/18   [provider]    Allergies Patient has no known allergies.  Family History  Problem Relation Age of Onset  . Breast cancer Neg Hx     Social History Social History   Tobacco Use  . Smoking status: Former Smoker    Packs/day: 1.00    Types: Cigarettes    Quit date: 1990    Years since quitting: 31.3  . Smokeless tobacco: Never Used  Substance Use Topics  . Alcohol use: Not Currently  . Drug use: Not Currently     Review of Systems  Constitutional: No fever/chills Eyes:  No discharge ENT: No upper respiratory  complaints. Respiratory: no cough. No SOB/ use of accessory muscles to breath Gastrointestinal:   No nausea, no vomiting.  No diarrhea.  No constipation. Musculoskeletal: Patient has right wrist pain.  Skin: Negative for rash, abrasions, lacerations, ecchymosis.    ____________________________________________   PHYSICAL EXAM:  VITAL SIGNS: ED Triage Vitals  Enc Vitals Group     BP 04/04/20 2025 137/69     Pulse Rate 04/04/20 2025 80     Resp 04/04/20 2025 18     Temp 04/04/20 2025 98.6 F (37 C)     Temp Source 04/04/20 2025 Oral     SpO2 04/04/20 2025 100 %     Weight 04/04/20 2019 114 lb (51.7 kg)     Height 04/04/20 2019 5' 2.5" (1.588 m)     Head Circumference --      Peak Flow --      Pain Score 04/04/20 2025 10     Pain Loc --      Pain Edu? --      Excl. in GC? --      Constitutional: Alert and oriented. Well appearing and in no acute distress. Eyes: Conjunctivae are normal. PERRL. EOMI. Head: Atraumatic. Cardiovascular: Normal rate, regular rhythm. Normal S1 and S2.  Good peripheral circulation. Respiratory: Normal respiratory effort without tachypnea or retractions. Lungs CTAB. Good air entry to the bases with no decreased or absent breath sounds Gastrointestinal: Bowel sounds x 4 quadrants. Soft and nontender to palpation. No guarding or rigidity. No distention. Musculoskeletal: Patient performs limited range of motion at the right wrist, likely secondary to pain.  She has edema over ulnar aspect of right wrist.  Palpable radial and ulnar pulses bilaterally and symmetrically. Neurologic:  Normal for age. No gross focal neurologic deficits are appreciated.  Skin: Patient has a 3 cm, triangular-shaped laceration along the radial aspect of the right forearm and a 0.5 cm laceration along the ulnar aspect of the right forearm. Psychiatric: Mood and affect are normal for age. Speech and behavior are normal.   ____________________________________________   LABS (all  labs ordered are listed, but only abnormal results are displayed)  Labs Reviewed - No data to display ____________________________________________  EKG   ____________________________________________  RADIOLOGY Geraldo Pitter, personally viewed and evaluated these images (plain radiographs) as part of my medical decision making, as well as reviewing the written report by the radiologist.  DG Wrist Complete Right  Result Date: 04/04/2020 CLINICAL DATA:  Status post fall. EXAM: RIGHT WRIST - COMPLETE 3+ VIEW COMPARISON:  None. FINDINGS: Acute fracture deformity is seen involving the distal right ulna. There is no evidence of dislocation. There is no evidence of arthropathy or other focal bone abnormality. Moderate severity focal soft tissue swelling is seen adjacent to the previously noted fracture  site. IMPRESSION: Acute fracture of the distal right ulna. Electronically Signed   By: Aram Candela M.D.   On: 04/04/2020 21:17    ____________________________________________    PROCEDURES  Procedure(s) performed:     Marland KitchenMarland KitchenLaceration Repair  Date/Time: 04/04/2020 10:15 PM Performed by: Orvil Feil, PA-C Authorized by: Orvil Feil, PA-C   Consent:    Consent obtained:  Verbal   Consent given by:  Patient Anesthesia (see MAR for exact dosages):    Anesthesia method:  None Laceration details:    Location:  Shoulder/arm   Shoulder/arm location:  R lower arm   Length (cm):  3   Depth (mm):  1 Repair type:    Repair type:  Simple Pre-procedure details:    Preparation:  Patient was prepped and draped in usual sterile fashion Exploration:    Contaminated: no   Treatment:    Area cleansed with:  Betadine   Amount of cleaning:  Standard   Irrigation solution:  Sterile saline   Irrigation volume:  500 cc   Visualized foreign bodies/material removed: no   Skin repair:    Repair method:  Tissue adhesive and sutures   Suture size:  4-0   Suture material:  Nylon    Number of sutures:  10 Approximation:    Approximation:  Close Post-procedure details:    Dressing:  Open (no dressing)   Patient tolerance of procedure:  Tolerated well, no immediate complications .Marland KitchenLaceration Repair  Date/Time: 04/04/2020 10:16 PM Performed by: Orvil Feil, PA-C Authorized by: Orvil Feil, PA-C   Consent:    Consent obtained:  Verbal   Consent given by:  Patient   Risks discussed:  Infection Anesthesia (see MAR for exact dosages):    Anesthesia method:  None Laceration details:    Location:  Shoulder/arm   Shoulder/arm location:  R lower arm   Length (cm):  0.5   Depth (mm):  1 Repair type:    Repair type:  Simple Exploration:    Contaminated: no   Treatment:    Area cleansed with:  Betadine   Amount of cleaning:  Standard   Irrigation solution:  Sterile saline   Irrigation volume:  500 cc   Visualized foreign bodies/material removed: no   Skin repair:    Repair method:  Tissue adhesive Approximation:    Approximation:  Close Post-procedure details:    Dressing:  Open (no dressing)   Patient tolerance of procedure:  Tolerated well, no immediate complications       Medications  lidocaine (XYLOCAINE) 1 % (with pres) injection 5 mL (has no administration in time range)  oxyCODONE-acetaminophen (PERCOCET/ROXICET) 5-325 MG per tablet 1 tablet (1 tablet Oral Given 04/04/20 2145)  ondansetron (ZOFRAN-ODT) disintegrating tablet 4 mg (4 mg Oral Given 04/04/20 2145)     ____________________________________________   INITIAL IMPRESSION / ASSESSMENT AND PLAN / ED COURSE  Pertinent labs & imaging results that were available during my care of the patient were reviewed by me and considered in my medical decision making (see chart for details).      Assessment and Plan:  Forearm pain: Ulna fracture  Open fracture  Right forearm laceration:  35-year-old female presents to the emergency department with right forearm pain after patient had a  mechanical fall.  She sustained lacerations along the radial and ulnar aspect of the forearm.  Lacerations were repaired in the emergency department.  Patient was placed in a volar splint and she was referred to orthopedics.  Percocet was  given for pain.  Patient was discharged with a short course of Percocet.  She was also discharged with Keflex given open nature of fracture.  Return precautions were given to return with new or worsening symptoms.   ____________________________________________  FINAL CLINICAL IMPRESSION(S) / ED DIAGNOSES  Final diagnoses:  Right wrist pain      NEW MEDICATIONS STARTED DURING THIS VISIT:  ED Discharge Orders         Ordered    oxyCODONE-acetaminophen (PERCOCET/ROXICET) 5-325 MG tablet  Every 6 hours PRN     04/04/20 2258    ondansetron (ZOFRAN) 4 MG tablet  Every 8 hours PRN     04/04/20 2258    cephALEXin (KEFLEX) 500 MG capsule  3 times daily     04/04/20 2259              This chart was dictated using voice recognition software/Dragon. Despite best efforts to proofread, errors can occur which can change the meaning. Any change was purely unintentional.      Lannie Fields, PA-C 04/04/20 2310    Harvest Dark, MD 04/05/20 [id number]

## 2020-04-04 NOTE — ED Triage Notes (Addendum)
Reports trying to break her dogs apart and she fell.  Patient with right wrist pain and laceration to right forearm.  Patient denies any type of bite from the dogs.

## 2021-05-18 ENCOUNTER — Other Ambulatory Visit: Payer: Self-pay | Admitting: Internal Medicine

## 2021-05-18 DIAGNOSIS — Z1231 Encounter for screening mammogram for malignant neoplasm of breast: Secondary | ICD-10-CM

## 2021-05-21 ENCOUNTER — Other Ambulatory Visit: Payer: Self-pay

## 2021-05-21 ENCOUNTER — Ambulatory Visit
Admission: RE | Admit: 2021-05-21 | Discharge: 2021-05-21 | Disposition: A | Discharge status: Home / Self Care | Payer: Medicare PPO | Source: Ambulatory Visit | Attending: Internal Medicine | Admitting: Internal Medicine

## 2021-05-21 DIAGNOSIS — Z1231 Encounter for screening mammogram for malignant neoplasm of breast: Secondary | ICD-10-CM

## 2022-04-19 ENCOUNTER — Other Ambulatory Visit: Payer: Self-pay | Admitting: Internal Medicine

## 2022-04-19 DIAGNOSIS — Z1231 Encounter for screening mammogram for malignant neoplasm of breast: Secondary | ICD-10-CM

## 2022-04-29 ENCOUNTER — Other Ambulatory Visit: Payer: Self-pay | Admitting: Internal Medicine

## 2022-05-12 ENCOUNTER — Other Ambulatory Visit: Payer: Self-pay | Admitting: Internal Medicine

## 2022-05-12 DIAGNOSIS — N958 Other specified menopausal and perimenopausal disorders: Secondary | ICD-10-CM

## 2022-05-23 ENCOUNTER — Ambulatory Visit
Admission: RE | Admit: 2022-05-23 | Discharge: 2022-05-23 | Disposition: A | Discharge status: Home / Self Care | Payer: Medicare PPO | Source: Ambulatory Visit | Attending: Internal Medicine | Admitting: Internal Medicine

## 2022-05-23 DIAGNOSIS — Z1231 Encounter for screening mammogram for malignant neoplasm of breast: Secondary | ICD-10-CM | POA: Insufficient documentation

## 2022-07-12 ENCOUNTER — Ambulatory Visit
Admission: RE | Admit: 2022-07-12 | Discharge: 2022-07-12 | Disposition: A | Discharge status: Home / Self Care | Payer: Medicare PPO | Source: Ambulatory Visit | Attending: Internal Medicine | Admitting: Internal Medicine

## 2022-07-12 DIAGNOSIS — N958 Other specified menopausal and perimenopausal disorders: Secondary | ICD-10-CM | POA: Diagnosis present

## 2023-01-27 IMAGING — MG MM DIGITAL SCREENING BILAT W/ TOMO AND CAD
8 series · 9 of 24 positions shown · non-contrast
Comparison: Previous exam(s).

CLINICAL DATA: Screening.

EXAM:
DIGITAL SCREENING BILATERAL MAMMOGRAM WITH TOMOSYNTHESIS AND CAD
TECHNIQUE: Bilateral screening digital craniocaudal and mediolateral oblique
mammograms were obtained. Bilateral screening digital breast
tomosynthesis was performed. The images were evaluated with
computer-aided detection.

[R CC synth-2D]
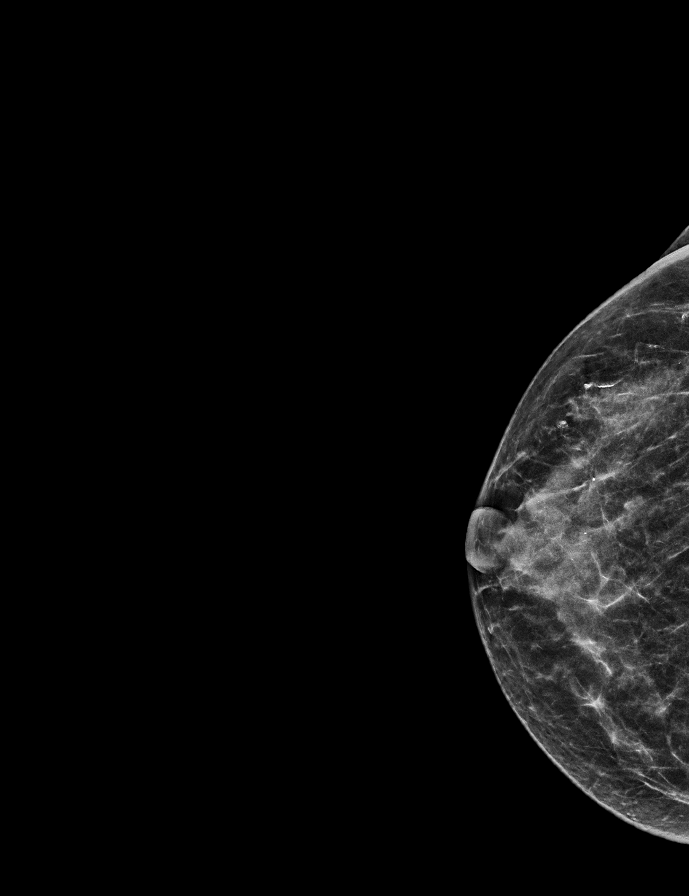

[R MLO synth-2D]
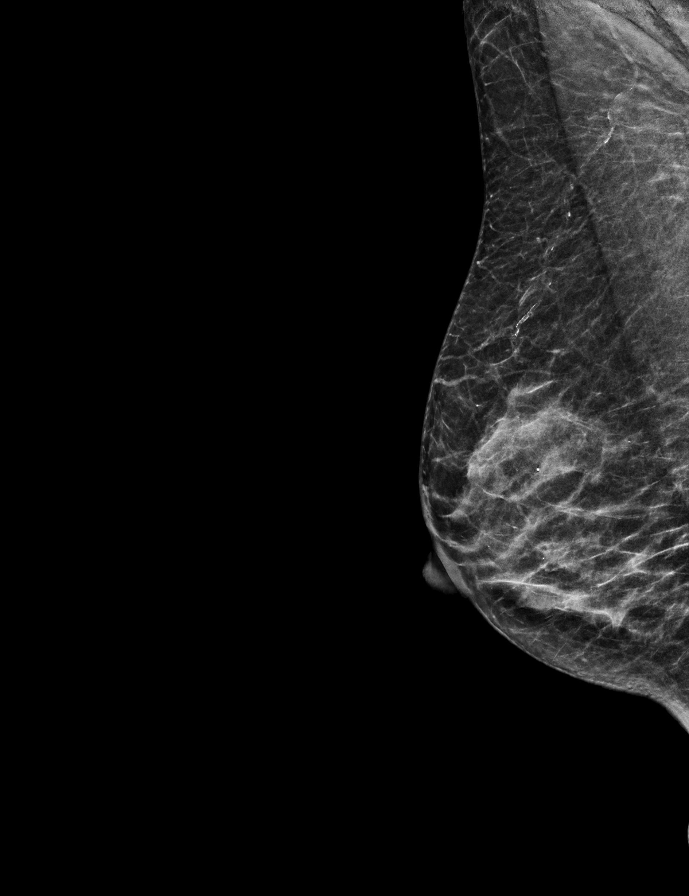

[L CC synth-2D]
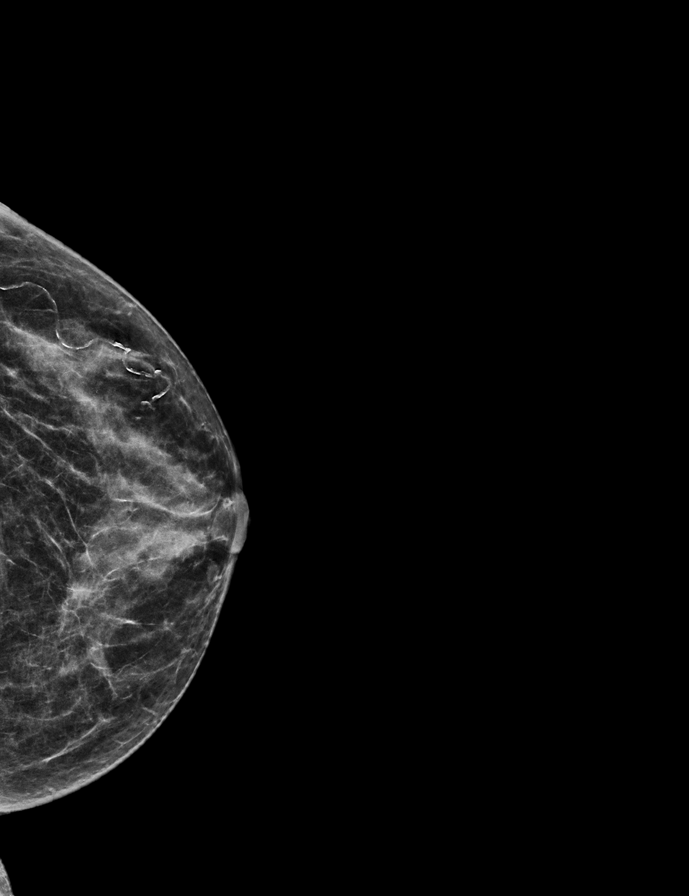

[L MLO synth-2D]
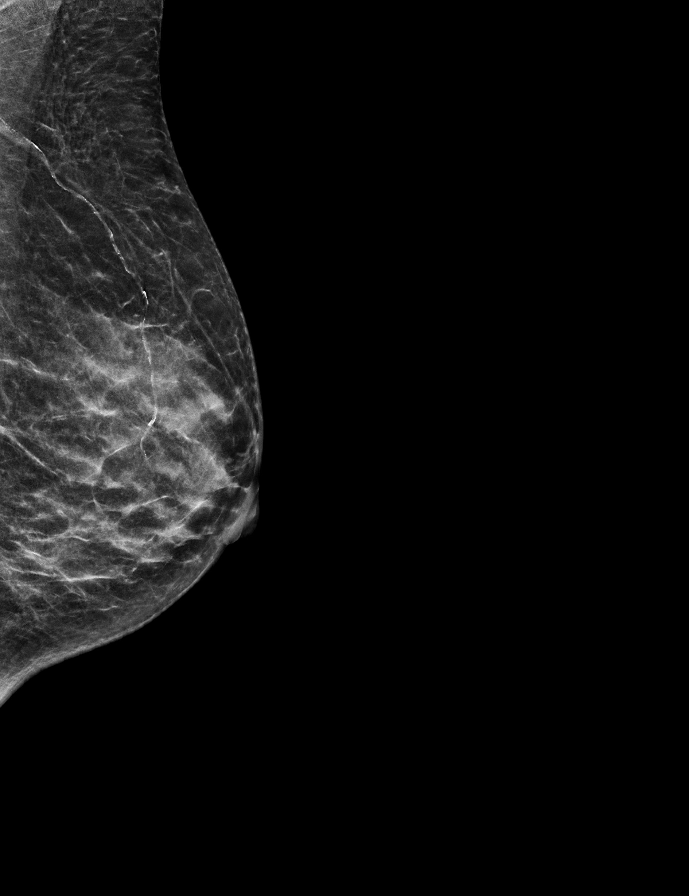

[L CC tomo · 2 of 48 frames shown]
[frame 16/48]
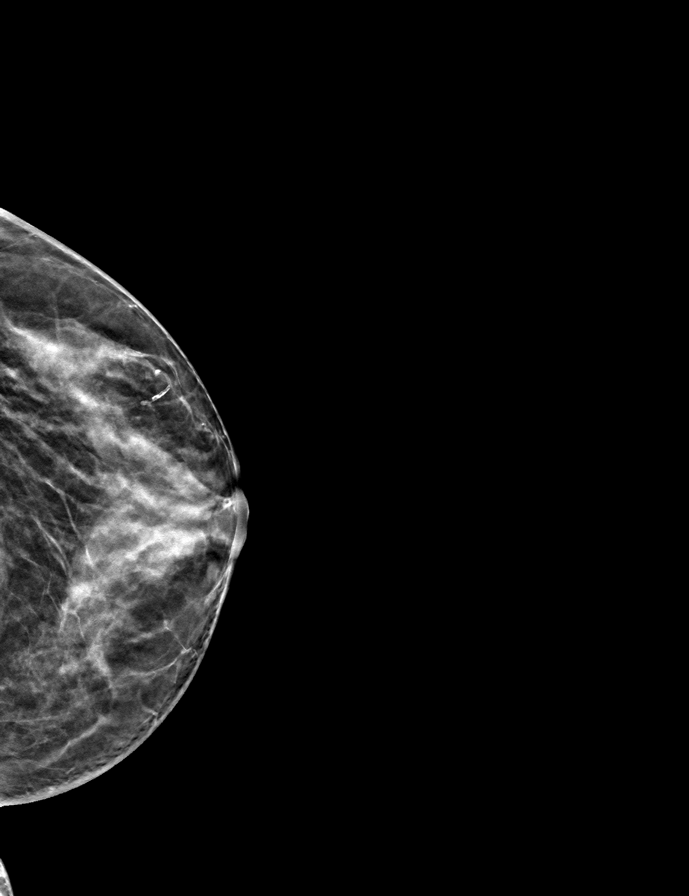
[frame 25/48]
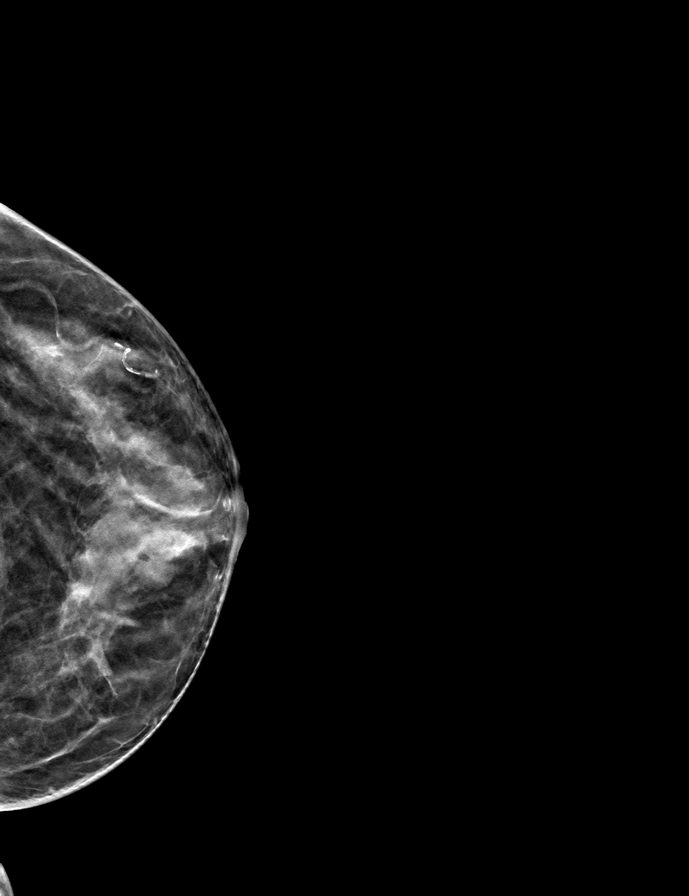

[R MLO tomo · tomo slice 23/44.0]
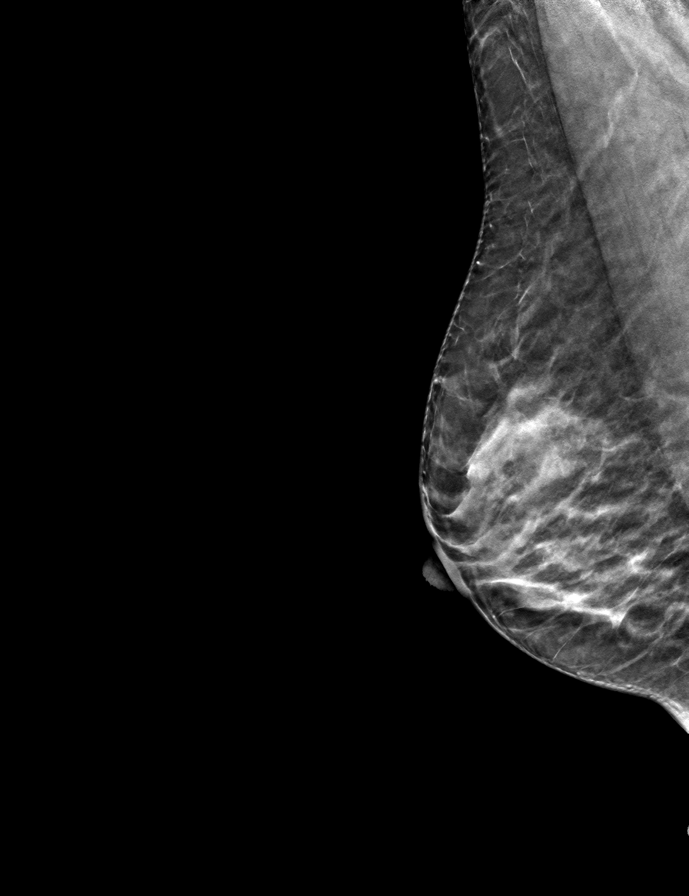

[L MLO tomo · tomo slice 23/45.0]
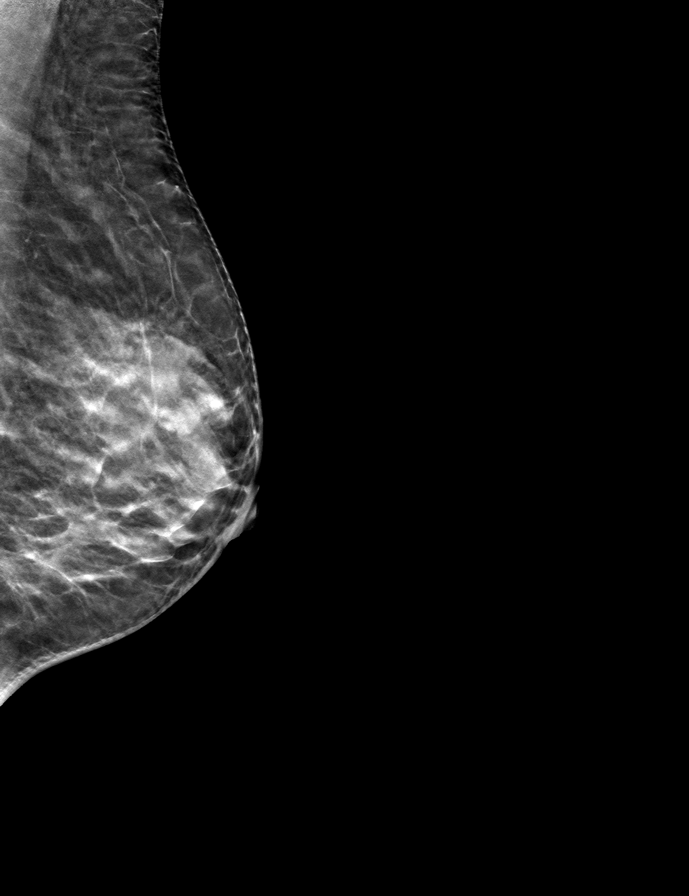

[R CC tomo · tomo slice 23/45.0]
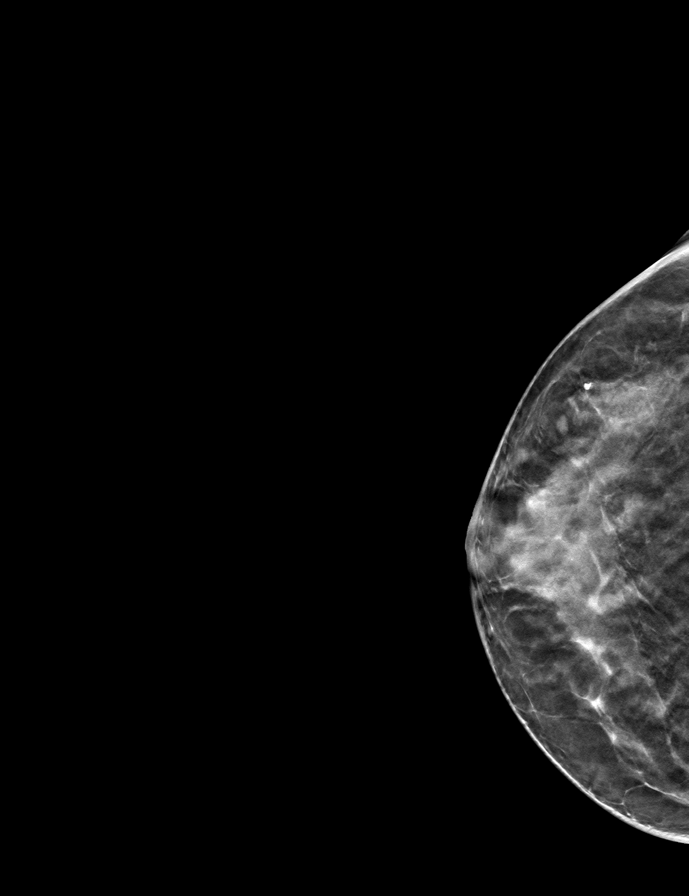

[9 of 24 positions shown; findings below may reference images not displayed]

ACR Breast Density Category c: The breast tissue is heterogeneously
dense, which may obscure small masses.
FINDINGS: There are no findings suspicious for malignancy.
IMPRESSION: No mammographic evidence of malignancy. A result letter of this
screening mammogram will be mailed directly to the patient.

RECOMMENDATION:
Screening mammogram in one year. (Code:Q3-W-BC3)

BI-RADS CATEGORY  1: Negative.

## 2023-07-11 ENCOUNTER — Other Ambulatory Visit: Payer: Self-pay | Admitting: Internal Medicine

## 2023-07-11 DIAGNOSIS — Z1231 Encounter for screening mammogram for malignant neoplasm of breast: Secondary | ICD-10-CM

## 2023-07-20 ENCOUNTER — Ambulatory Visit
Admission: RE | Admit: 2023-07-20 | Discharge: 2023-07-20 | Disposition: A | Discharge status: Home / Self Care | Payer: Medicare PPO | Source: Ambulatory Visit | Attending: Internal Medicine | Admitting: Internal Medicine

## 2023-07-20 DIAGNOSIS — Z1231 Encounter for screening mammogram for malignant neoplasm of breast: Secondary | ICD-10-CM | POA: Insufficient documentation

## 2024-06-25 ENCOUNTER — Other Ambulatory Visit: Payer: Self-pay | Admitting: Internal Medicine

## 2024-06-25 DIAGNOSIS — Z1231 Encounter for screening mammogram for malignant neoplasm of breast: Secondary | ICD-10-CM

## 2024-07-23 ENCOUNTER — Ambulatory Visit
Admission: RE | Admit: 2024-07-23 | Discharge: 2024-07-23 | Disposition: A | Discharge status: Home / Self Care | Source: Ambulatory Visit | Attending: Internal Medicine | Admitting: Internal Medicine

## 2024-07-23 DIAGNOSIS — Z1231 Encounter for screening mammogram for malignant neoplasm of breast: Secondary | ICD-10-CM | POA: Diagnosis present
